# Patient Record
Sex: Female | Born: 1990 | Race: White | Hispanic: No | Marital: Single | Smoking: Never smoker
Health system: Southern US, Community
[De-identification: ages and names within clinical notes are randomized; demographics above are authoritative.]

## PROBLEM LIST (undated history)

## (undated) DIAGNOSIS — D500 Iron deficiency anemia secondary to blood loss (chronic): Secondary | ICD-10-CM

## (undated) DIAGNOSIS — N921 Excessive and frequent menstruation with irregular cycle: Secondary | ICD-10-CM

## (undated) HISTORY — DX: Iron deficiency anemia secondary to blood loss (chronic): D50.0

## (undated) HISTORY — DX: Excessive and frequent menstruation with irregular cycle: N92.1

---
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
## 2014-01-17 ENCOUNTER — Telehealth: Payer: Self-pay | Admitting: Hematology & Oncology

## 2014-01-17 NOTE — Telephone Encounter (Signed)
Left vm w NEW PATIENT today to remind them of their appointment with Dr. Ennever. Also, advised them to bring all medication bottles and insurance card information. ° °

## 2014-01-18 ENCOUNTER — Ambulatory Visit: Payer: BC Managed Care – PPO

## 2014-01-18 ENCOUNTER — Ambulatory Visit (HOSPITAL_BASED_OUTPATIENT_CLINIC_OR_DEPARTMENT_OTHER): Payer: BC Managed Care – PPO | Admitting: Lab

## 2014-01-18 ENCOUNTER — Ambulatory Visit (HOSPITAL_BASED_OUTPATIENT_CLINIC_OR_DEPARTMENT_OTHER): Payer: BC Managed Care – PPO | Admitting: Family

## 2014-01-18 VITALS — BP 110/74 | HR 57 | Temp 98.8°F | Wt 134.0 lb

## 2014-01-18 DIAGNOSIS — N921 Excessive and frequent menstruation with irregular cycle: Secondary | ICD-10-CM

## 2014-01-18 DIAGNOSIS — D500 Iron deficiency anemia secondary to blood loss (chronic): Secondary | ICD-10-CM

## 2014-01-18 DIAGNOSIS — D509 Iron deficiency anemia, unspecified: Secondary | ICD-10-CM

## 2014-01-18 DIAGNOSIS — R5383 Other fatigue: Secondary | ICD-10-CM

## 2014-01-18 DIAGNOSIS — D649 Anemia, unspecified: Secondary | ICD-10-CM

## 2014-01-18 LAB — CBC WITH DIFFERENTIAL (CANCER CENTER ONLY)
BASO#: 0 10*3/uL (ref 0.0–0.2)
BASO%: 0.3 % (ref 0.0–2.0)
EOS ABS: 0.1 10*3/uL (ref 0.0–0.5)
EOS%: 1 % (ref 0.0–7.0)
HCT: 37.9 % (ref 34.8–46.6)
HGB: 12.2 g/dL (ref 11.6–15.9)
LYMPH#: 3 10*3/uL (ref 0.9–3.3)
LYMPH%: 38.4 % (ref 14.0–48.0)
MCH: 27.2 pg (ref 26.0–34.0)
MCHC: 32.2 g/dL (ref 32.0–36.0)
MCV: 84 fL (ref 81–101)
MONO#: 0.7 10*3/uL (ref 0.1–0.9)
MONO%: 8.4 % (ref 0.0–13.0)
NEUT%: 51.9 % (ref 39.6–80.0)
NEUTROS ABS: 4.1 10*3/uL (ref 1.5–6.5)
Platelets: 217 10*3/uL (ref 145–400)
RBC: 4.49 10*6/uL (ref 3.70–5.32)
RDW: 15.9 % — ABNORMAL HIGH (ref 11.1–15.7)
WBC: 7.8 10*3/uL (ref 3.9–10.0)

## 2014-01-18 LAB — TSH CHCC: TSH: 1.494 m[IU]/L (ref 0.308–3.960)

## 2014-01-18 LAB — IRON AND TIBC CHCC
%SAT: 11 % — ABNORMAL LOW (ref 21–57)
IRON: 56 ug/dL (ref 41–142)
TIBC: 501 ug/dL — ABNORMAL HIGH (ref 236–444)
UIBC: 445 ug/dL — ABNORMAL HIGH (ref 120–384)

## 2014-01-18 LAB — CHCC SATELLITE - SMEAR

## 2014-01-18 LAB — FERRITIN CHCC: FERRITIN: 17 ng/mL (ref 9–269)

## 2014-01-18 NOTE — Progress Notes (Signed)
Hematology/Oncology Consultation   Name: Sheila Morris      MRN: [id number]    Location: Room/bed info not found  Date: 01/18/2014 Time:9:35 AM   REFERRING PHYSICIAN:  Haze Rushingeborah Cobb, FNP  REASON FOR CONSULT:  Iron deficiency anemia   DIAGNOSIS:  Iron deficiency anemia  HISTORY OF PRESENT ILLNESS:  Sheila Morris is a very pleasant 23 yo female with history of iron deficiency anemia. She states that she has some fatigue. She plays college soccer and has noticed that she has been more tired than usual. She denies any family history of anemia or blood disorders. Sh denies having heavy cycles. She takes oral iron 3 times daily. She does not take antacids. She does not smoke or drink. She denies fever, chills, rash, mouth sores, SOB, chest pain, palpitations, abdominal pain, constipation, diarrhea, blood in urine or stool. She denies swelling or tenderness in her extremities.  She recently was treated for Lyme disease with antibiotics and her last test was negative. She has had surgery on her left foot and hand.   ROS: All other 10 point review of systems is negative except for what is mentioned above.  PAST MEDICAL HISTORY:   No past medical history on file.  ALLERGIES: Allergies not on file    MEDICATIONS: No current outpatient prescriptions on file prior to visit.   No current facility-administered medications on file prior to visit.   PAST SURGICAL HISTORY No past surgical history on file.  FAMILY HISTORY: No family history on file.  SOCIAL HISTORY:  has no tobacco, alcohol, and drug history on file.  PERFORMANCE STATUS: The patient's performance status is 0 - Asymptomatic  PHYSICAL EXAM: Most Recent Vital Signs: There were no vitals taken for this visit. There were no vitals taken for this visit.  General Appearance:    Alert, cooperative, no distress, appears stated age  Head:    Normocephalic, without obvious abnormality, atraumatic  Eyes:    PERRL, conjunctiva/corneas clear,  EOM's intact, fundi    benign, both eyes        Throat:   Lips, mucosa, and tongue normal; teeth and gums normal  Neck:   Supple, symmetrical, trachea midline, no adenopathy;    thyroid:  no enlargement/tenderness/nodules; no carotid   bruit or JVD  Back:     Symmetric, no curvature, ROM normal, no CVA tenderness  Lungs:     Clear to auscultation bilaterally, respirations unlabored  Chest Wall:    No tenderness or deformity   Heart:    Regular rate and rhythm, S1 and S2 normal, no murmur, rub   or gallop     Abdomen:     Soft, non-tender, bowel sounds active all four quadrants,    no masses, no organomegaly        Extremities:   Extremities normal, atraumatic, no cyanosis or edema  Pulses:   2+ and symmetric all extremities  Skin:   Skin color, texture, turgor normal, no rashes or lesions  Lymph nodes:   Cervical, supraclavicular, and axillary nodes normal  Neurologic:   CNII-XII intact, normal strength, sensation and reflexes    throughout   LABORATORY DATA:  Results for orders placed in visit on 01/18/14 (from the past 48 hour(s))  CBC WITH DIFFERENTIAL ([facility])     Status: Abnormal   Collection Time    01/18/14  8:47 AM      Result Value Ref Range   WBC 7.8  3.9 - 10.0 10e3/uL   RBC 4.49  3.70 - 5.32 10e6/uL   HGB 12.2  11.6 - 15.9 g/dL   HCT 16.1  09.6 - 04.5 %   MCV 84  81 - 101 fL   MCH 27.2  26.0 - 34.0 pg   MCHC 32.2  32.0 - 36.0 g/dL   RDW 40.9 (*) 81.1 - 91.4 %   Platelets 217  145 - 400 10e3/uL   NEUT# 4.1  1.5 - 6.5 10e3/uL   LYMPH# 3.0  0.9 - 3.3 10e3/uL   MONO# 0.7  0.1 - 0.9 10e3/uL   Eosinophils Absolute 0.1  0.0 - 0.5 10e3/uL   BASO# 0.0  0.0 - 0.2 10e3/uL   NEUT% 51.9  39.6 - 80.0 %   LYMPH% 38.4  14.0 - 48.0 %   MONO% 8.4  0.0 - 13.0 %   EOS% 1.0  0.0 - 7.0 %   BASO% 0.3  0.0 - 2.0 %  [facility] - SMEAR     Status: None   Collection Time    01/18/14  8:47 AM      Result Value Ref Range   Smear Result Smear Available        RADIOGRAPHY: No results found.     PATHOLOGY: None   ASSESSMENT/PLAN: Ms. Folker is a very pleasant 23 yo female with history of iron deficiency anemia. She is tired today.  Her CBC is unremarkable. We will wait and see what her iron studies show and make a plan at that time.  All questions were answered. The patient knows to call the clinic with any problems, questions or concerns. We can certainly see the patient much sooner if necessary.  The patient and plan discussed with and also seen by Dr. Myna Hidalgo and he is in agreement with the aforementioned.   [facility]    ADDENDUM:  I saw and examined Sheila Morris. She is very nice. She plays soccer at [employer].  I looked at her blood smear. There was some microcytic red cells. There are worrisome hypochromic red cells. It certainly appeared consistent with iron deficiency even though she's taking oral iron.  Her exam was unremarkable. There is no liver or spleen tip. She had no rashes. There is no joint problems.  She's taking oral iron. She's not having any problems with this. She does have her monthly cycles. I told him that they are all that heavy.  Her iron studies do show iron deficiency. Her ferritin is only 17. Her iron saturation is 11%. Her reticulocyte count is 1%.  I think that we help her by giving her IV iron. Again, she is iron deficient. She's not really anemic but yet I believe that she still would benefit from it when necessary we'll switch her muscle function.  We will go ahead and give her Feraheme. We will give her a dose of 1020 mg.  We will have her come back to see [place] in about 4-6 weeks.  I think that he I will help her and allow her to play better. Her soccer season starts in a few weeks.  Spent a good 45 minutes with her. We answered her questions.  Hewitt Shorts

## 2014-01-19 ENCOUNTER — Telehealth: Payer: Self-pay | Admitting: Hematology & Oncology

## 2014-01-19 ENCOUNTER — Encounter: Payer: Self-pay | Admitting: Family

## 2014-01-19 DIAGNOSIS — D500 Iron deficiency anemia secondary to blood loss (chronic): Secondary | ICD-10-CM

## 2014-01-19 DIAGNOSIS — N921 Excessive and frequent menstruation with irregular cycle: Secondary | ICD-10-CM | POA: Insufficient documentation

## 2014-01-19 HISTORY — DX: Excessive and frequent menstruation with irregular cycle: N92.1

## 2014-01-19 HISTORY — DX: Iron deficiency anemia secondary to blood loss (chronic): D50.0

## 2014-01-19 NOTE — Telephone Encounter (Signed)
Pt aware she needs iron infusion. 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 She is requesting to fax information to her school to help her find the right day to do this. She is aware RN will call her in regard of this. JoEllen RN aware. Pt is also aware to clear some messages off her phone so we can leave a message if neccessary.

## 2014-01-20 LAB — HEMOGLOBINOPATHY EVALUATION
HGB A: 97.8 % (ref 96.8–97.8)
HGB F QUANT: 0 % (ref 0.0–2.0)
HGB S QUANTITAION: 0 %
Hemoglobin Other: 0 %
Hgb A2 Quant: 2.2 % (ref 2.2–3.2)

## 2014-01-20 LAB — RETICULOCYTES (CHCC)
ABS RETIC: 49.8 10*3/uL (ref 19.0–186.0)
RBC.: 4.53 MIL/uL (ref 3.87–5.11)
RETIC CT PCT: 1.1 % (ref 0.4–2.3)

## 2014-01-21 ENCOUNTER — Ambulatory Visit (HOSPITAL_BASED_OUTPATIENT_CLINIC_OR_DEPARTMENT_OTHER): Payer: BC Managed Care – PPO

## 2014-01-21 VITALS — BP 118/70 | HR 57 | Temp 97.1°F | Resp 20

## 2014-01-21 DIAGNOSIS — N920 Excessive and frequent menstruation with regular cycle: Secondary | ICD-10-CM

## 2014-01-21 DIAGNOSIS — D500 Iron deficiency anemia secondary to blood loss (chronic): Secondary | ICD-10-CM

## 2014-01-21 MED ORDER — SODIUM CHLORIDE 0.9 % IV SOLN
Freq: Once | INTRAVENOUS | Status: AC
  Administered 2014-01-21: 14:00:00 via INTRAVENOUS

## 2014-01-21 MED ORDER — SODIUM CHLORIDE 0.9 % IV SOLN
1020.0000 mg | Freq: Once | INTRAVENOUS | Status: AC
  Administered 2014-01-21: 1020 mg via INTRAVENOUS
  Filled 2014-01-21: qty 34

## 2014-01-21 MED ORDER — SODIUM CHLORIDE 0.9 % IV SOLN: 1020.0000 mg | Freq: Once | INTRAVENOUS | Status: DC

## 2014-01-21 MED ORDER — SODIUM CHLORIDE 0.9 % IV SOLN: Freq: Once | INTRAVENOUS | Status: DC

## 2014-01-21 NOTE — Patient Instructions (Signed)

## 2014-02-14 ENCOUNTER — Telehealth: Payer: Self-pay | Admitting: Hematology & Oncology

## 2014-02-14 NOTE — Telephone Encounter (Signed)
Patient called and cx 02/16/14 apt and resch for 03/28/14 °

## 2014-02-16 ENCOUNTER — Ambulatory Visit: Payer: BC Managed Care – PPO | Admitting: Hematology & Oncology

## 2014-02-16 ENCOUNTER — Other Ambulatory Visit: Payer: Self-pay | Admitting: *Deleted

## 2014-02-16 ENCOUNTER — Other Ambulatory Visit: Payer: BC Managed Care – PPO | Admitting: Lab

## 2014-02-16 DIAGNOSIS — D500 Iron deficiency anemia secondary to blood loss (chronic): Secondary | ICD-10-CM

## 2014-02-16 NOTE — Progress Notes (Signed)
Patients mom called stating that her daughter has been very tired and would like to have labwork checked.  Ok with Dr. Myna Hidalgo.  Orders placed and appt made

## 2014-02-17 ENCOUNTER — Other Ambulatory Visit: Payer: BC Managed Care – PPO | Admitting: Lab

## 2014-02-22 ENCOUNTER — Other Ambulatory Visit: Payer: BC Managed Care – PPO | Admitting: Lab

## 2014-02-23 ENCOUNTER — Other Ambulatory Visit: Payer: BC Managed Care – PPO | Admitting: Lab

## 2014-02-23 ENCOUNTER — Telehealth: Payer: Self-pay | Admitting: Hematology & Oncology

## 2014-02-23 NOTE — Telephone Encounter (Signed)
Patient walked in office thinking her apt was sch for today.  Patient apt was sch for 02/23/14 but was cx and resch for 02/22/14.  Patient wasn't aware of the apt changes, so she showed up.  While patient was here i resch her lab apt for 02/28/14.  Patient received calendars for sept and oct aptts

## 2014-02-28 ENCOUNTER — Other Ambulatory Visit (HOSPITAL_BASED_OUTPATIENT_CLINIC_OR_DEPARTMENT_OTHER): Payer: BC Managed Care – PPO | Admitting: Lab

## 2014-02-28 DIAGNOSIS — D509 Iron deficiency anemia, unspecified: Secondary | ICD-10-CM

## 2014-02-28 LAB — FERRITIN CHCC: FERRITIN: 331 ng/mL — AB (ref 9–269)

## 2014-02-28 LAB — CBC WITH DIFFERENTIAL (CANCER CENTER ONLY)
BASO#: 0 10*3/uL (ref 0.0–0.2)
BASO%: 0.1 % (ref 0.0–2.0)
EOS ABS: 0.1 10*3/uL (ref 0.0–0.5)
EOS%: 0.6 % (ref 0.0–7.0)
HCT: 35.4 % (ref 34.8–46.6)
HGB: 11.6 g/dL (ref 11.6–15.9)
LYMPH#: 2 10*3/uL (ref 0.9–3.3)
LYMPH%: 22.4 % (ref 14.0–48.0)
MCH: 29 pg (ref 26.0–34.0)
MCHC: 32.8 g/dL (ref 32.0–36.0)
MCV: 89 fL (ref 81–101)
MONO#: 0.7 10*3/uL (ref 0.1–0.9)
MONO%: 7.5 % (ref 0.0–13.0)
NEUT%: 69.4 % (ref 39.6–80.0)
NEUTROS ABS: 6.2 10*3/uL (ref 1.5–6.5)
Platelets: 184 10*3/uL (ref 145–400)
RBC: 4 10*6/uL (ref 3.70–5.32)
RDW: 17.5 % — AB (ref 11.1–15.7)
WBC: 8.9 10*3/uL (ref 3.9–10.0)

## 2014-02-28 LAB — RETICULOCYTES (CHCC)
ABS Retic: 45 10*3/uL (ref 19.0–186.0)
RBC.: 4.09 MIL/uL (ref 3.87–5.11)
RETIC CT PCT: 1.1 % (ref 0.4–2.3)

## 2014-02-28 LAB — IRON AND TIBC CHCC
%SAT: 22 % (ref 21–57)
IRON: 88 ug/dL (ref 41–142)
TIBC: 398 ug/dL (ref 236–444)
UIBC: 309 ug/dL (ref 120–384)

## 2014-03-02 ENCOUNTER — Telehealth: Payer: Self-pay | Admitting: *Deleted

## 2014-03-02 NOTE — Telephone Encounter (Signed)
Message copied by Anselm Jungling on Wed Mar 02, 2014  1:28 PM ------      Message from: Josph Macho      Created: Tue Mar 01, 2014  7:34 AM       Call- iron is better!!  pete ------

## 2014-03-02 NOTE — Telephone Encounter (Signed)
Called patient left message on personal cell phone that her iron levels were much better

## 2014-03-28 ENCOUNTER — Encounter: Payer: Self-pay | Admitting: Hematology & Oncology

## 2014-03-28 ENCOUNTER — Ambulatory Visit (HOSPITAL_BASED_OUTPATIENT_CLINIC_OR_DEPARTMENT_OTHER): Payer: BC Managed Care – PPO | Admitting: Hematology & Oncology

## 2014-03-28 ENCOUNTER — Other Ambulatory Visit (HOSPITAL_BASED_OUTPATIENT_CLINIC_OR_DEPARTMENT_OTHER): Payer: BC Managed Care – PPO | Admitting: Lab

## 2014-03-28 VITALS — BP 120/58 | HR 60 | Temp 97.4°F | Resp 14 | Ht 65.0 in | Wt 136.0 lb

## 2014-03-28 DIAGNOSIS — D500 Iron deficiency anemia secondary to blood loss (chronic): Secondary | ICD-10-CM

## 2014-03-28 DIAGNOSIS — D509 Iron deficiency anemia, unspecified: Secondary | ICD-10-CM

## 2014-03-28 LAB — CBC WITH DIFFERENTIAL (CANCER CENTER ONLY)
BASO#: 0 10*3/uL (ref 0.0–0.2)
BASO%: 0.2 % (ref 0.0–2.0)
EOS ABS: 0.2 10*3/uL (ref 0.0–0.5)
EOS%: 1.3 % (ref 0.0–7.0)
HEMATOCRIT: 37.4 % (ref 34.8–46.6)
HGB: 12.5 g/dL (ref 11.6–15.9)
LYMPH#: 3.2 10*3/uL (ref 0.9–3.3)
LYMPH%: 24.6 % (ref 14.0–48.0)
MCH: 29.7 pg (ref 26.0–34.0)
MCHC: 33.4 g/dL (ref 32.0–36.0)
MCV: 89 fL (ref 81–101)
MONO#: 0.8 10*3/uL (ref 0.1–0.9)
MONO%: 6 % (ref 0.0–13.0)
NEUT%: 67.9 % (ref 39.6–80.0)
NEUTROS ABS: 8.9 10*3/uL — AB (ref 1.5–6.5)
PLATELETS: 200 10*3/uL (ref 145–400)
RBC: 4.21 10*6/uL (ref 3.70–5.32)
RDW: 15.2 % (ref 11.1–15.7)
WBC: 13.1 10*3/uL — AB (ref 3.9–10.0)

## 2014-03-28 NOTE — Progress Notes (Signed)
  Hematology and Oncology Follow Up Visit  Sheila Morris [id number] 10/05/1990 23 y.o. 03/28/2014   Principle Diagnosis:   Iron deficiency anemia  Current Therapy:    IV iron     Interim History:  Ms.  Sheila Morris is back for followup. She was first seen back in early August. I think she goes [employer]. She plays soccer. She has been feeling tired. When we first saw her, her ferritin was 7 with iron saturation of 11%. We did go ahead and give her iron. She got Feraheme at 1020 mg. In September, her ferritin was 331. Her iron saturation was 22%.  She still does not feel all that much better. She still plays soccer. She does get tired. She does not have heavy cycles. There is no bleeding otherwise. She's had no fever. She's had no nausea or vomiting. She is not a vegetarian. She's had no fever. She's had no leg swelling. She's had no change in bowel or bladder habits.  Medications: Current outpatient prescriptions:drospirenone-ethinyl estradiol (YAZ,GIANVI,LORYNA) 3-0.02 MG tablet, Take 1 tablet by mouth daily., Disp: , Rfl: ;  FLUoxetine (PROZAC) 40 MG capsule, Take 40 mg by mouth daily., Disp: , Rfl:   Allergies: No Known Allergies  Past Medical History, Surgical history, Social history, and Family History were reviewed and updated.  Review of Systems: As above  Physical Exam:  height is 5\' 5"  (1.651 m) and weight is 136 lb (61.689 kg). Her oral temperature is 97.4 F (36.3 C). Her blood pressure is 120/58 and her pulse is 60. Her respiration is 14.   Well-developed and well-nourished white female. Head and neck exam shows no ocular or oral lesions. There are no palpable cervical or supraclavicular lymph nodes. Lungs are clear. Cardiac exam regular in rhythm with no murmurs, rubs or bruits. Abdomen is soft. She is good bowel sounds. There is no palpable liver or spleen tip. Extremities shows no clubbing, cyanosis or edema. Neurological exam is nonfocal. Skin exam no rashes,  ecchymoses or petechia.  Lab Results  Component Value Date   WBC 13.1* 03/28/2014   HGB 12.5 03/28/2014   HCT 37.4 03/28/2014   MCV 89 03/28/2014   PLT 200 03/28/2014     Chemistry   No results found for this basename: NA, K, CL, CO2, BUN, CREATININE, GLU   No results found for this basename: CALCIUM, ALKPHOS, AST, ALT, BILITOT         Impression and Plan: Ms. Sheila Morris is 23 year old female. She had an iron deficiency anemia. She still feels somewhat tired.  It will be interesting to see what her iron levels show.  We will see what her iron studies are. I suppose she could still be iron deficient.  We will plan to get her back to the clinic based on the iron studies.  Hopefully, she will be iron deficient and will need one more dose of iron  We will plan for a followup depending on her iron studies.   Josph MachoENNEVER,PETER R, MD 10/12/20156:15 PM

## 2014-03-29 LAB — IRON AND TIBC CHCC
%SAT: 18 % — ABNORMAL LOW (ref 21–57)
IRON: 67 ug/dL (ref 41–142)
TIBC: 383 ug/dL (ref 236–444)
UIBC: 315 ug/dL (ref 120–384)

## 2014-03-29 LAB — FERRITIN CHCC: Ferritin: 276 ng/ml — ABNORMAL HIGH (ref 9–269)

## 2014-03-30 ENCOUNTER — Telehealth: Payer: Self-pay | Admitting: *Deleted

## 2014-03-30 ENCOUNTER — Other Ambulatory Visit: Payer: Self-pay | Admitting: *Deleted

## 2014-03-30 DIAGNOSIS — D500 Iron deficiency anemia secondary to blood loss (chronic): Secondary | ICD-10-CM

## 2014-03-30 NOTE — Telephone Encounter (Signed)
Message copied by Anselm JunglingBARTKO, Destin Kittler ELLEN O on Wed Mar 30, 2014  5:53 PM ------      Message from: Arlan OrganENNEVER, PETER R      Created: Wed Mar 30, 2014  7:52 AM       Please call and tell her that her iron is actually low. Please set her up with Feraheme 1020 mg. She goes to school. We need to work this in with her soccer schedule. Thanks. Pete ------

## 2014-04-01 ENCOUNTER — Telehealth: Payer: Self-pay | Admitting: Hematology & Oncology

## 2014-04-01 NOTE — Telephone Encounter (Signed)
Left pt message to call for appointment °

## 2014-04-11 ENCOUNTER — Ambulatory Visit (HOSPITAL_BASED_OUTPATIENT_CLINIC_OR_DEPARTMENT_OTHER): Payer: BC Managed Care – PPO

## 2014-04-11 VITALS — BP 111/57 | HR 59 | Temp 98.2°F | Resp 16

## 2014-04-11 DIAGNOSIS — D500 Iron deficiency anemia secondary to blood loss (chronic): Secondary | ICD-10-CM

## 2014-04-11 DIAGNOSIS — D509 Iron deficiency anemia, unspecified: Secondary | ICD-10-CM

## 2014-04-11 MED ORDER — SODIUM CHLORIDE 0.9 % IV SOLN
Freq: Once | INTRAVENOUS | Status: AC
  Administered 2014-04-11: 14:00:00 via INTRAVENOUS

## 2014-04-11 MED ORDER — SODIUM CHLORIDE 0.9 % IV SOLN
1020.0000 mg | Freq: Once | INTRAVENOUS | Status: AC
  Administered 2014-04-11: 1020 mg via INTRAVENOUS
  Filled 2014-04-11: qty 34

## 2014-04-11 NOTE — Patient Instructions (Signed)

## 2015-07-27 ENCOUNTER — Telehealth: Payer: Self-pay | Admitting: Hematology & Oncology

## 2015-07-27 NOTE — Telephone Encounter (Signed)
Called patient and left a message confirming upcoming appt(s).       [facility]. °

## 2015-07-31 ENCOUNTER — Ambulatory Visit (HOSPITAL_BASED_OUTPATIENT_CLINIC_OR_DEPARTMENT_OTHER): Payer: BLUE CROSS/BLUE SHIELD | Admitting: Hematology & Oncology

## 2015-07-31 ENCOUNTER — Ambulatory Visit: Payer: BLUE CROSS/BLUE SHIELD

## 2015-07-31 ENCOUNTER — Encounter: Payer: Self-pay | Admitting: Hematology & Oncology

## 2015-07-31 ENCOUNTER — Other Ambulatory Visit (HOSPITAL_BASED_OUTPATIENT_CLINIC_OR_DEPARTMENT_OTHER): Payer: BLUE CROSS/BLUE SHIELD

## 2015-07-31 VITALS — BP 108/63 | HR 67 | Temp 98.4°F | Resp 16 | Ht 65.0 in | Wt 139.0 lb

## 2015-07-31 DIAGNOSIS — D509 Iron deficiency anemia, unspecified: Secondary | ICD-10-CM | POA: Diagnosis not present

## 2015-07-31 DIAGNOSIS — D500 Iron deficiency anemia secondary to blood loss (chronic): Secondary | ICD-10-CM

## 2015-07-31 LAB — COMPREHENSIVE METABOLIC PANEL
ALT: 23 U/L (ref 0–55)
AST: 27 U/L (ref 5–34)
Albumin: 3.7 g/dL (ref 3.5–5.0)
Alkaline Phosphatase: 57 U/L (ref 40–150)
Anion Gap: 14 mEq/L — ABNORMAL HIGH (ref 3–11)
BILIRUBIN TOTAL: 0.3 mg/dL (ref 0.20–1.20)
BUN: 17.8 mg/dL (ref 7.0–26.0)
CO2: 25 meq/L (ref 22–29)
CREATININE: 0.8 mg/dL (ref 0.6–1.1)
Calcium: 9.2 mg/dL (ref 8.4–10.4)
Chloride: 104 mEq/L (ref 98–109)
GLUCOSE: 100 mg/dL (ref 70–140)
Potassium: 3.8 mEq/L (ref 3.5–5.1)
SODIUM: 142 meq/L (ref 136–145)
TOTAL PROTEIN: 7.2 g/dL (ref 6.4–8.3)

## 2015-07-31 LAB — CBC WITH DIFFERENTIAL (CANCER CENTER ONLY)
BASO#: 0 10*3/uL (ref 0.0–0.2)
BASO%: 0.2 % (ref 0.0–2.0)
EOS%: 0.6 % (ref 0.0–7.0)
Eosinophils Absolute: 0.1 10*3/uL (ref 0.0–0.5)
HCT: 38.5 % (ref 34.8–46.6)
HGB: 12.4 g/dL (ref 11.6–15.9)
LYMPH#: 3.4 10*3/uL — ABNORMAL HIGH (ref 0.9–3.3)
LYMPH%: 38.7 % (ref 14.0–48.0)
MCH: 28.6 pg (ref 26.0–34.0)
MCHC: 32.2 g/dL (ref 32.0–36.0)
MCV: 89 fL (ref 81–101)
MONO#: 0.6 10*3/uL (ref 0.1–0.9)
MONO%: 6.7 % (ref 0.0–13.0)
NEUT#: 4.7 10*3/uL (ref 1.5–6.5)
NEUT%: 53.8 % (ref 39.6–80.0)
PLATELETS: 185 10*3/uL (ref 145–400)
RBC: 4.33 10*6/uL (ref 3.70–5.32)
RDW: 13.8 % (ref 11.1–15.7)
WBC: 8.8 10*3/uL (ref 3.9–10.0)

## 2015-07-31 NOTE — Progress Notes (Signed)
Hematology and Oncology Follow Up Visit  Sheila Morris [id number] [birth year] 25 y.o. 07/31/2015   Principle Diagnosis:   History of iron deficiency anemia  Current Therapy:    IV iron as indicated.     Interim History:  Sheila Morris is back for a long way to follow-up. We last saw her back in October 2015. That point time, she came in because she was feeling tired. Her iron studies at the time showed a ferritin of 276 with iron saturation of only 18%.  Previously, her ferritin was 17 with iron saturation of 11%.  She got IV iron. This helped her for a little bit. She is joint mice. She still goes to [employer]. 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  Apparently, there was some concern about her ferritin level. There is 1 lab work that was drawn which showed a ferritin of 628. This was done on January 13. No other iron studies were done. Does have a drink or 2. She is of age now.  She has had normal monthly cycles. She's had no bleeding otherwise.  She's had no joint problems. She's had no cough. She has had no fever. She has had no rashes.  She does have hyperpigmented spots on her back. I think she sees a dermatologist.  She has had no weight loss or weight gain.  Overall, her performance status is ECOG 0.  Medications:  Current outpatient prescriptions:  .  drospirenone-ethinyl estradiol (YAZ,GIANVI,LORYNA) 3-0.02 MG tablet, Take 1 tablet by mouth daily., Disp: , Rfl:  .  FLUoxetine (PROZAC) 40 MG capsule, Take 40 mg by mouth daily., Disp: , Rfl:   Allergies: No Known Allergies  Past Medical History, Surgical history, Social history, and Family History were reviewed and updated.  Review of Systems: As above  Physical Exam:  height is  (1.651 m) and weight is 139 lb (63.05 kg). Her oral temperature is 98.4 F (36.9 C). Her blood pressure is 108/63 and her pulse is 67. Her respiration is 16.   Wt Readings from Last 3 Encounters:  07/31/15 139 lb  (63.05 kg)  03/28/14 136 lb (61.689 kg)  01/18/14 134 lb (60.782 kg)     Well-developed and well-nourished white female in no obvious distress. Head and neck exam shows no ocular or oral lesions. She has no palpable cervical or supraclavicular lymph nodes. Lungs are clear bilaterally. Cardiac exam regular rate and rhythm with no murmurs rubs or bruits. Abdomen is soft. She has good bowel sounds. There is no fluid wave. There is no palpable liver or spleen tip. Back exam shows no tenderness over the spine, ribs or hips. Extremities shows no clubbing, cyanosis or edema. Skin exam shows no rashes, ecchymoses or petechia. Neurological exam shows no focal neurological deficits.  Lab Results  Component Value Date   WBC 8.8 07/31/2015   HGB 12.4 07/31/2015   HCT 38.5 07/31/2015   MCV 89 07/31/2015   PLT 185 07/31/2015     Chemistry      Component Value Date/Time   NA 142 07/31/2015 1315   K 3.8 07/31/2015 1315   CO2 25 07/31/2015 1315   BUN 17.8 07/31/2015 1315   CREATININE 0.8 07/31/2015 1315      Component Value Date/Time   CALCIUM 9.2 07/31/2015 1315   ALKPHOS 57 07/31/2015 1315   AST 27 07/31/2015 1315   ALT 23 07/31/2015 1315   BILITOT 0.30 07/31/2015 1315         Impression and  Plan: Sheila Morris is a 25 year old white female. She has a history of iron deficiency anemia. She says she does not feel all that well now. I suppose she may be iron deficient again. I looked at her blood smear. I was not that impressed.  Her MCV is not all that low. She really is not that anemic by any means.  I think this elevated ferritin is probably an acute phase reactant. I do not know if this was drawn after she had some alcoholic beverages.  I really do not see that hemochromatosis is an issue. I do not see that iron overload is no issue.  We will go ahead and see what her iron studies show. If, for [place], she is iron deficient, we will get iron and her.  If her iron levels are okay. I'm  not sure what else could be going on with her. She probably would have to have her thyroid checked.  It was nice to see her again. I spent about 40 minutes with her.  Josph Macho, MD 2/13/20175:08 PM

## 2015-08-01 ENCOUNTER — Telehealth: Payer: Self-pay | Admitting: *Deleted

## 2015-08-01 ENCOUNTER — Other Ambulatory Visit: Payer: Self-pay | Admitting: *Deleted

## 2015-08-01 ENCOUNTER — Ambulatory Visit: Payer: BLUE CROSS/BLUE SHIELD

## 2015-08-01 ENCOUNTER — Telehealth: Payer: Self-pay | Admitting: Hematology & Oncology

## 2015-08-01 DIAGNOSIS — D500 Iron deficiency anemia secondary to blood loss (chronic): Secondary | ICD-10-CM

## 2015-08-01 LAB — IRON AND TIBC
%SAT: 12 % — AB (ref 21–57)
IRON: 46 ug/dL (ref 41–142)
TIBC: 390 ug/dL (ref 236–444)
UIBC: 344 ug/dL (ref 120–384)

## 2015-08-01 LAB — FERRITIN: Ferritin: 359 ng/ml — ABNORMAL HIGH (ref 9–269)

## 2015-08-01 NOTE — Telephone Encounter (Signed)
Called patient and left a message confirming upcoming appt(s).       [facility]. °

## 2015-08-01 NOTE — Telephone Encounter (Addendum)
Patient is aware of results and recommendations. Appointment for feraheme made. Message sent to scheduler for f/u with Sarah.   ----- Message from Josph Macho, MD sent at 08/01/2015  9:19 AM EST ----- Call - iron level is low!!  You need 1 dose of Feraheme.  You CANNOT drink alcohol for a couple of months.  It is causing your liver to inflame!!  Make sure that she has an appt with Sarah in 4-5 weeks.  pete

## 2015-08-08 ENCOUNTER — Ambulatory Visit (HOSPITAL_BASED_OUTPATIENT_CLINIC_OR_DEPARTMENT_OTHER): Payer: BLUE CROSS/BLUE SHIELD

## 2015-08-08 DIAGNOSIS — D500 Iron deficiency anemia secondary to blood loss (chronic): Secondary | ICD-10-CM

## 2015-08-08 DIAGNOSIS — D509 Iron deficiency anemia, unspecified: Secondary | ICD-10-CM

## 2015-08-08 MED ORDER — SODIUM CHLORIDE 0.9 % IV SOLN
INTRAVENOUS | Status: DC
  Administered 2015-08-08: 12:00:00 via INTRAVENOUS

## 2015-08-08 MED ORDER — SODIUM CHLORIDE 0.9 % IV SOLN
510.0000 mg | Freq: Once | INTRAVENOUS | Status: AC
  Administered 2015-08-08: 510 mg via INTRAVENOUS
  Filled 2015-08-08: qty 17

## 2015-08-08 NOTE — Patient Instructions (Signed)

## 2015-08-29 ENCOUNTER — Ambulatory Visit: Payer: BLUE CROSS/BLUE SHIELD | Admitting: Family

## 2015-08-29 ENCOUNTER — Other Ambulatory Visit: Payer: Self-pay | Admitting: *Deleted

## 2015-08-29 ENCOUNTER — Other Ambulatory Visit: Payer: BLUE CROSS/BLUE SHIELD

## 2015-08-29 DIAGNOSIS — D500 Iron deficiency anemia secondary to blood loss (chronic): Secondary | ICD-10-CM

## 2015-08-30 ENCOUNTER — Other Ambulatory Visit: Payer: BLUE CROSS/BLUE SHIELD

## 2015-08-30 ENCOUNTER — Ambulatory Visit: Payer: BLUE CROSS/BLUE SHIELD | Admitting: Family

## 2015-09-05 ENCOUNTER — Other Ambulatory Visit (HOSPITAL_BASED_OUTPATIENT_CLINIC_OR_DEPARTMENT_OTHER): Payer: BLUE CROSS/BLUE SHIELD

## 2015-09-05 ENCOUNTER — Encounter: Payer: Self-pay | Admitting: Family

## 2015-09-05 ENCOUNTER — Ambulatory Visit (HOSPITAL_BASED_OUTPATIENT_CLINIC_OR_DEPARTMENT_OTHER): Payer: BLUE CROSS/BLUE SHIELD | Admitting: Family

## 2015-09-05 VITALS — BP 110/68 | HR 64 | Temp 98.4°F | Resp 16 | Ht 65.0 in | Wt 137.0 lb

## 2015-09-05 DIAGNOSIS — D509 Iron deficiency anemia, unspecified: Secondary | ICD-10-CM

## 2015-09-05 DIAGNOSIS — R5383 Other fatigue: Secondary | ICD-10-CM

## 2015-09-05 DIAGNOSIS — D500 Iron deficiency anemia secondary to blood loss (chronic): Secondary | ICD-10-CM

## 2015-09-05 LAB — COMPREHENSIVE METABOLIC PANEL
ALBUMIN: 3.9 g/dL (ref 3.5–5.0)
ALK PHOS: 59 U/L (ref 40–150)
ALT: 15 U/L (ref 0–55)
AST: 25 U/L (ref 5–34)
Anion Gap: 10 mEq/L (ref 3–11)
BUN: 12.4 mg/dL (ref 7.0–26.0)
CALCIUM: 9.5 mg/dL (ref 8.4–10.4)
CO2: 28 mEq/L (ref 22–29)
Chloride: 102 mEq/L (ref 98–109)
Creatinine: 0.9 mg/dL (ref 0.6–1.1)
Glucose: 100 mg/dl (ref 70–140)
POTASSIUM: 3.9 meq/L (ref 3.5–5.1)
Sodium: 140 mEq/L (ref 136–145)
TOTAL PROTEIN: 7.4 g/dL (ref 6.4–8.3)
Total Bilirubin: 0.75 mg/dL (ref 0.20–1.20)

## 2015-09-05 LAB — CBC WITH DIFFERENTIAL (CANCER CENTER ONLY)
BASO#: 0 10*3/uL (ref 0.0–0.2)
BASO%: 0.3 % (ref 0.0–2.0)
EOS ABS: 0.1 10*3/uL (ref 0.0–0.5)
EOS%: 1 % (ref 0.0–7.0)
HCT: 39.3 % (ref 34.8–46.6)
HGB: 13.1 g/dL (ref 11.6–15.9)
LYMPH#: 2.7 10*3/uL (ref 0.9–3.3)
LYMPH%: 40 % (ref 14.0–48.0)
MCH: 29.4 pg (ref 26.0–34.0)
MCHC: 33.3 g/dL (ref 32.0–36.0)
MCV: 88 fL (ref 81–101)
MONO#: 0.5 10*3/uL (ref 0.1–0.9)
MONO%: 7.5 % (ref 0.0–13.0)
NEUT#: 3.5 10*3/uL (ref 1.5–6.5)
NEUT%: 51.2 % (ref 39.6–80.0)
PLATELETS: 188 10*3/uL (ref 145–400)
RBC: 4.46 10*6/uL (ref 3.70–5.32)
RDW: 13.7 % (ref 11.1–15.7)
WBC: 6.8 10*3/uL (ref 3.9–10.0)

## 2015-09-05 LAB — IRON AND TIBC
%SAT: 38 % (ref 21–57)
IRON: 143 ug/dL — AB (ref 41–142)
TIBC: 379 ug/dL (ref 236–444)
UIBC: 236 ug/dL (ref 120–384)

## 2015-09-05 LAB — FERRITIN: Ferritin: 764 ng/ml — ABNORMAL HIGH (ref 9–269)

## 2015-09-05 NOTE — Progress Notes (Signed)
Hematology and Oncology Follow Up Visit  Sheila Morris [id number] 06-06-1991 25 y.o. 09/05/2015   Principle Diagnosis:  Iron deficiency anemia  Current Therapy:   IV iron as indicated    Interim History:  Sheila Morris is here today for a follow-up. She is still having some fatigue and chewing ice. Her most recent dose of Feraheme was in February. Her iron saturation at that time was 12% with a ferritin of 359. Her cycles have been regular and not heavy. No other bleeding or bruising.  No fever, chills, n/v, cough, rash, dizziness, SOB, chest pain, palpitations, abdominal pain or changes in bowel or bladder habits.  No swelling, tenderness, numbness or tingling in her extremities.  She has maintained a good appetite and is staying well hydrated. Her weight is stable.  She is staying active playing soccer and doing well in school.   Medications:    Medication List       This list is accurate as of: 09/05/15  1:22 PM.  Always use your most recent med list.               drospirenone-ethinyl estradiol 3-0.02 MG tablet  Commonly known as:  YAZ,GIANVI,LORYNA  Take 1 tablet by mouth daily.     venlafaxine XR 75 MG 24 hr capsule  Commonly known as:  EFFEXOR-XR        Allergies: No Known Allergies  Past Medical History, Surgical history, Social history, and Family History were reviewed and updated.  Review of Systems: All other 10 point review of systems is negative.   Physical Exam:  height is  (1.651 m) and weight is 137 lb (62.143 kg). Her oral temperature is 98.4 F (36.9 C). Her blood pressure is 110/68 and her pulse is 64. Her respiration is 16.   Wt Readings from Last 3 Encounters:  09/05/15 137 lb (62.143 kg)  07/31/15 139 lb (63.05 kg)  03/28/14 136 lb (61.689 kg)    Ocular: Sclerae unicteric, pupils equal, round and reactive to light Ear-nose-throat: Oropharynx clear, dentition fair Lymphatic: No cervical supraclavicular or axillary adenopathy Lungs  no rales or rhonchi, good excursion bilaterally Heart regular rate and rhythm, no murmur appreciated Abd soft, nontender, positive bowel sounds, no liver or spleen tip palpated on exam, no fluid wave MSK no focal spinal tenderness, no joint edema Neuro: non-focal, well-oriented, appropriate affect Breasts: Deferred  Lab Results  Component Value Date   WBC 6.8 09/05/2015   HGB 13.1 09/05/2015   HCT 39.3 09/05/2015   MCV 88 09/05/2015   PLT 188 09/05/2015   Lab Results  Component Value Date   FERRITIN 359* 07/31/2015   IRON 46 07/31/2015   TIBC 390 07/31/2015   UIBC 344 07/31/2015   IRONPCTSAT 12* 07/31/2015   Lab Results  Component Value Date   RETICCTPCT 1.1 02/28/2014   RBC 4.46 09/05/2015   RETICCTABS 45.0 02/28/2014   No results found for: KPAFRELGTCHN, LAMBDASER, KAPLAMBRATIO No results found for: IGGSERUM, IGA, IGMSERUM No results found for: Dorene Ar, A1GS, A2GS, Karn Pickler, SPEI   Chemistry      Component Value Date/Time   NA 142 07/31/2015 1315   K 3.8 07/31/2015 1315   CO2 25 07/31/2015 1315   BUN 17.8 07/31/2015 1315   CREATININE 0.8 07/31/2015 1315      Component Value Date/Time   CALCIUM 9.2 07/31/2015 1315   ALKPHOS 57 07/31/2015 1315   AST 27 07/31/2015 1315   ALT 23 07/31/2015 1315  BILITOT 0.30 07/31/2015 1315     Impression and Plan: Sheila Morris is a 25 yo white female with iron deficiency anemia. She is still symptomatic with fatigue and has been chewing ice.  Her Hgb is stable at 13.1. We will see what her iron studies show and bring her in later this week for an infusion if needed.  We will plan to see her back in 2 months for lab work and follow-up.  She will contact us with any questions or concerns. We can certainly see him sooner if need be.   Verdie MosherINCINNATI,SARAH M, NP 3/21/20171:22 PM

## 2015-09-06 ENCOUNTER — Telehealth: Payer: Self-pay | Admitting: *Deleted

## 2015-09-06 NOTE — Telephone Encounter (Addendum)
Patient aware of results  ----- Message from Josph MachoPeter R Ennever, MD sent at 09/06/2015  7:11 AM EDT ----- Call - iron levels are fantastic!!!  Sheila Morris

## 2015-11-06 ENCOUNTER — Ambulatory Visit (HOSPITAL_BASED_OUTPATIENT_CLINIC_OR_DEPARTMENT_OTHER): Payer: BLUE CROSS/BLUE SHIELD | Admitting: Family

## 2015-11-06 ENCOUNTER — Other Ambulatory Visit (HOSPITAL_BASED_OUTPATIENT_CLINIC_OR_DEPARTMENT_OTHER): Payer: BLUE CROSS/BLUE SHIELD

## 2015-11-06 ENCOUNTER — Encounter: Payer: Self-pay | Admitting: Family

## 2015-11-06 ENCOUNTER — Telehealth: Payer: Self-pay

## 2015-11-06 VITALS — BP 105/67 | HR 77 | Temp 98.0°F | Resp 18 | Ht 65.0 in | Wt 136.0 lb

## 2015-11-06 DIAGNOSIS — D509 Iron deficiency anemia, unspecified: Secondary | ICD-10-CM | POA: Diagnosis not present

## 2015-11-06 DIAGNOSIS — N921 Excessive and frequent menstruation with irregular cycle: Secondary | ICD-10-CM

## 2015-11-06 DIAGNOSIS — D500 Iron deficiency anemia secondary to blood loss (chronic): Secondary | ICD-10-CM

## 2015-11-06 LAB — CBC WITH DIFFERENTIAL (CANCER CENTER ONLY)
BASO#: 0 10*3/uL (ref 0.0–0.2)
BASO%: 0.3 % (ref 0.0–2.0)
EOS%: 1 % (ref 0.0–7.0)
Eosinophils Absolute: 0.1 10*3/uL (ref 0.0–0.5)
HCT: 40 % (ref 34.8–46.6)
HEMOGLOBIN: 13.4 g/dL (ref 11.6–15.9)
LYMPH#: 1.7 10*3/uL (ref 0.9–3.3)
LYMPH%: 26.9 % (ref 14.0–48.0)
MCH: 29.6 pg (ref 26.0–34.0)
MCHC: 33.5 g/dL (ref 32.0–36.0)
MCV: 89 fL (ref 81–101)
MONO#: 0.4 10*3/uL (ref 0.1–0.9)
MONO%: 6.7 % (ref 0.0–13.0)
NEUT%: 65.1 % (ref 39.6–80.0)
NEUTROS ABS: 4.1 10*3/uL (ref 1.5–6.5)
Platelets: 197 10*3/uL (ref 145–400)
RBC: 4.52 10*6/uL (ref 3.70–5.32)
RDW: 13.5 % (ref 11.1–15.7)
WBC: 6.3 10*3/uL (ref 3.9–10.0)

## 2015-11-06 LAB — FERRITIN: FERRITIN: 519 ng/mL — AB (ref 9–269)

## 2015-11-06 LAB — IRON AND TIBC
%SAT: 26 % (ref 21–57)
IRON: 96 ug/dL (ref 41–142)
TIBC: 368 ug/dL (ref 236–444)
UIBC: 271 ug/dL (ref 120–384)

## 2015-11-06 NOTE — Progress Notes (Signed)
Hematology and Oncology Follow Up Visit  [name] Favela [id number] 10/01/1990 25 y.o. 11/06/2015   Principle Diagnosis:  Iron deficiency anemia  Current Therapy:   IV iron as indicated    Interim History:  [name] is here today for a follow-up. She is doing well and has no complaints at this time. She received a dose of Feraheme in February and has responded nicely. Her iron saturation at that time was 12%.  Her cycles have been regular and not heavy. No fever, chills, ice cravings, n/v, cough, rash, dizziness, SOB, chest pain, palpitations, abdominal pain or changes in bowel or bladder habits.  No episodes of bleeding or bruising. No lymphadenopathy found on exam.  No swelling, tenderness, numbness or tingling in her extremities. No c/o joint aches or pain.  She is staying well hydrated. Her weight is stable.  She is staying active playing soccer and doing well in her master's program with school.   Medications:    Medication List       This list is accurate as of: 11/06/15 11:31 AM.  Always use your most recent med list.               buPROPion 150 MG 24 hr tablet  Commonly known as:  WELLBUTRIN XL  Take 150 mg by mouth every morning.     drospirenone-ethinyl estradiol 3-0.02 MG tablet  Commonly known as:  YAZ,GIANVI,LORYNA  Take 1 tablet by mouth daily.     venlafaxine XR 75 MG 24 hr capsule  Commonly known as:  EFFEXOR-XR        Allergies: No Known Allergies  Past Medical History, Surgical history, Social history, and Family History were reviewed and updated.  Review of Systems: All other 10 point review of systems is negative.   Physical Exam:  height is 5\' 5"  (1.651 m) and weight is 136 lb (61.689 kg). Her oral temperature is 98 F (36.7 C). Her blood pressure is 105/67 and her pulse is 77. Her respiration is 18.   Wt Readings from Last 3 Encounters:  11/06/15 136 lb (61.689 kg)  09/05/15 137 lb (62.143 kg)  07/31/15 139 lb (63.05 kg)    Ocular:  Sclerae unicteric, pupils equal, round and reactive to light Ear-nose-throat: Oropharynx clear, dentition fair Lymphatic: No cervical supraclavicular or axillary adenopathy Lungs no rales or rhonchi, good excursion bilaterally Heart regular rate and rhythm, no murmur appreciated Abd soft, nontender, positive bowel sounds, no liver or spleen tip palpated on exam, no fluid wave MSK no focal spinal tenderness, no joint edema Neuro: non-focal, well-oriented, appropriate affect Breasts: Deferred  Lab Results  Component Value Date   WBC 6.3 11/06/2015   HGB 13.4 11/06/2015   HCT 40.0 11/06/2015   MCV 89 11/06/2015   PLT 197 11/06/2015   Lab Results  Component Value Date   FERRITIN 764* 09/05/2015   IRON 143* 09/05/2015   TIBC 379 09/05/2015   UIBC 236 09/05/2015   IRONPCTSAT 38 09/05/2015   Lab Results  Component Value Date   RETICCTPCT 1.1 02/28/2014   RBC 4.52 11/06/2015   RETICCTABS 45.0 02/28/2014   No results found for: KPAFRELGTCHN, LAMBDASER, KAPLAMBRATIO No results found for: IGGSERUM, IGA, IGMSERUM No results found for: Dorene ArOTALPROTELP, ALBUMINELP, A1GS, A2GS, Karn PicklerBETS, BETA2SER, GAMS, MSPIKE, SPEI   Chemistry      Component Value Date/Time   NA 140 09/05/2015 0949   K 3.9 09/05/2015 0949   CO2 28 09/05/2015 0949   BUN 12.4 09/05/2015 0949   CREATININE  0.9 09/05/2015 0949      Component Value Date/Time   CALCIUM 9.5 09/05/2015 0949   ALKPHOS 59 09/05/2015 0949   AST 25 09/05/2015 0949   ALT 15 09/05/2015 0949   BILITOT 0.75 09/05/2015 0949     Impression and Plan: Ms. Farnam is a 25 yo white female with iron deficiency anemia. She is feeling much better since receiving Feraheme in February and is asymptomatic at this time.  Her Hgb is stable at 13.4 with an MCV of 89. We will see what her iron studies show and bring her in later this week for an infusion if needed.  We will plan to see her back in 3 months for lab work and follow-up.  She will contact us with any  questions or concerns. We can certainly see him sooner if need be.   Verdie Mosher, NP 5/22/201711:31 AM

## 2015-11-06 NOTE — Telephone Encounter (Addendum)
-----   Message from Sheila MosherSarah M [place], NP sent at 11/06/2015  3:01 PM EDT ----- Regarding: iron Iron studies look good. No infusion needed! Thank you :)  Maralyn SagoSarah  Above message given to pt via phone. Voices understanding & appreciation. dph

## 2016-02-05 ENCOUNTER — Ambulatory Visit: Payer: BLUE CROSS/BLUE SHIELD | Admitting: Family

## 2016-02-05 ENCOUNTER — Other Ambulatory Visit: Payer: BLUE CROSS/BLUE SHIELD
